# Patient Record
Sex: Male | Born: 1965 | Race: White | Hispanic: No | Smoking: Former smoker
Health system: Southern US, Community
[De-identification: ages and names within clinical notes are randomized; demographics above are authoritative.]

## PROBLEM LIST (undated history)

## (undated) DIAGNOSIS — I10 Essential (primary) hypertension: Secondary | ICD-10-CM

## (undated) HISTORY — PX: EYE SURGERY: SHX253

## (undated) HISTORY — PX: FINGER SURGERY: SHX640

## (undated) HISTORY — DX: Essential (primary) hypertension: I10

---

## 2006-07-21 ENCOUNTER — Inpatient Hospital Stay (HOSPITAL_COMMUNITY): Admission: AC | Admit: 2006-07-21 | Discharge: 2006-07-22 | Payer: Self-pay
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
## 2009-08-06 ENCOUNTER — Emergency Department (HOSPITAL_COMMUNITY): Admission: EM | Admit: 2009-08-06 | Discharge: 2009-08-06 | Payer: Self-pay | Admitting: Emergency Medicine
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
## 2012-01-20 ENCOUNTER — Encounter: Payer: Self-pay | Admitting: Vascular Surgery

## 2012-01-23 ENCOUNTER — Encounter: Payer: Self-pay | Admitting: Vascular Surgery

## 2012-01-23 ENCOUNTER — Ambulatory Visit (INDEPENDENT_AMBULATORY_CARE_PROVIDER_SITE_OTHER): Payer: BC Managed Care – PPO | Admitting: Vascular Surgery

## 2012-01-23 ENCOUNTER — Other Ambulatory Visit: Payer: Self-pay | Admitting: *Deleted

## 2012-01-23 VITALS — BP 132/84 | HR 94 | Resp 18 | Ht 71.0 in | Wt 202.0 lb

## 2012-01-23 DIAGNOSIS — I83893 Varicose veins of bilateral lower extremities with other complications: Secondary | ICD-10-CM

## 2012-01-23 DIAGNOSIS — M79609 Pain in unspecified limb: Secondary | ICD-10-CM

## 2012-01-23 NOTE — Progress Notes (Signed)
Subjective:     Patient ID: Noah Rivera, male   DOB: 06/20/1966, 46 y.o.   MRN: [id number]  HPI this 46 year old male patient was referred for severe venous insufficiency left leg. He has been having bulging varicosities in the thigh and calf on the left leg for the last 15 years. He has had increasing symptoms of aching burning throbbing discomfort as well as swelling in the ankle as the day progresses. He works on his feet as a Visual merchandiser daily. He is not were elastic compression stockings nor elevate his legs or regular basis or take ibuprofen. Symptoms continued to worsen and are more severe the longer he is on his feet.  Past Medical History  Diagnosis Date  . Hypertension     History  Substance Use Topics  . Smoking status: Not on file  . Smokeless tobacco: Not on file  . Alcohol Use:     History reviewed. No pertinent family history.  No Known Allergies  Current outpatient prescriptions:lisinopril (PRINIVIL,ZESTRIL) 20 MG tablet, Take 20 mg by mouth daily., Disp: , Rfl:   BP 132/84  Pulse 94  Resp 18  Ht 5\' 11"  (1.803 m)  Wt 202 lb (91.627 kg)  BMI 28.17 kg/m2  Body mass index is 28.17 kg/(m^2).         Review of Systems denies chest pain, dyspnea on exertion, PND, orthopnea, hemoptysis, claudication. All systems are negative and complete review of systems    Objective:   Physical Exam blood pressure 130/84 heart rate 94 respirations 18 General well-developed well-nourished male in no apparent stress alert and oriented x3 HEENT normal for age Lungs no rhonchi or wheezing Cardiovascular regular rhythm no murmurs carotid pulses 3+ no audible bruits Abdomen soft nontender with no masses Musculoskeletal free major deformities Neurologic normal Lower extremity exam reveals bulging varicosities in the left distal thigh and calf medially with early hyperpigmentation and 1+ edema distally. 3+ dorsalis pedis and posterior tibial pulse is palpable bilaterally. There are  no varicosities in the contralateral right leg.  Today I ordered a venous duplex exam in the left leg which are reviewed and interpreted. There is gross reflux in the left great saphenous vein from the knee to the saphenofemoral junction the vein is large. There is no DVT. The left small saphenous vein is unremarkable.    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 If not significant improvement he should have laser ablation left great saphenous vein with greater than 20 stab phlebectomy

## 2012-01-31 NOTE — Procedures (Unsigned)
LOWER EXTREMITY VENOUS REFLUX EXAM  INDICATION:  Left lower extremity pain and varicosities  EXAM:  Using color-flow imaging and pulse Doppler spectral analysis, the left common femoral, femoral, popliteal, posterior tibial, great and small saphenous veins were evaluated.  There is evidence suggesting deep venous insufficiency in the left lower extremity.  The left saphenofemoral junction is not competent with reflux of >500 milliseconds.  The left GSV is not competent with reflux of >500 milliseconds with the caliber as described below.   The left proximal small saphenous vein demonstrates competency.  GSV Diameter (used if found to be incompetent only)                                                 Right     Left Proximal Greater Saphenous Vein                 cm        0.89 cm Proximal-to-mid-thigh                           cm        0.67 cm Mid thigh                                       cm        1.13 cm Mid-distal thigh                                cm        cm Distal thigh                                    cm        0.94 cm Knee                                            cm        0.93 cm  IMPRESSION: 1. The left great saphenous vein is not competent with reflux of >500     milliseconds. 2. The left great saphenous vein it not tortuous. 3. Deep venous system of the left lower extremity is not competent     with reflux of >500 milliseconds. 4. The left small saphenous vein is competent. 5. There is a perforator vein at the left mid distal medial calf     measuring 0.34 cm that appears to be competent.  ___________________________________________ Quita Skye. Hart [place], M.D.  SH/MEDQ  D:  01/23/2012  T:  01/23/2012  Job:  [id number]

## 2012-04-19 ENCOUNTER — Encounter: Payer: Self-pay | Admitting: Vascular Surgery

## 2012-04-23 ENCOUNTER — Encounter: Payer: Self-pay | Admitting: Vascular Surgery

## 2012-04-23 ENCOUNTER — Ambulatory Visit (INDEPENDENT_AMBULATORY_CARE_PROVIDER_SITE_OTHER): Payer: BC Managed Care – PPO | Admitting: Vascular Surgery

## 2012-04-23 VITALS — BP 127/85 | HR 64 | Resp 20 | Ht 72.0 in | Wt 191.0 lb

## 2012-04-23 DIAGNOSIS — I8310 Varicose veins of unspecified lower extremity with inflammation: Secondary | ICD-10-CM | POA: Insufficient documentation

## 2012-04-23 NOTE — Progress Notes (Signed)
Subjective:     Patient ID: Jeanann Lewandowsky, male   DOB: August 08, 1966, 46 y.o.   MRN: [id number]  HPI this 46 year old male returns today for followup regarding her severe venous insufficiency of the left leg. This has progressed over the past 10-15 years with increasing aching throbbing and burning discomfort. He has been wearing long leg elastic compression stockings 20-30 mm gradient and trying elevation and ibuprofen with no success. His symptoms continued to be as severe as previously. He is able to run and this helps his symptoms slightly while running. He has no history of DVT, thrombophlebitis, stasis ulcers, bleeding, or other complications. 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  Past Medical History  Diagnosis Date  . Hypertension     History  Substance Use Topics  . Smoking status: Former Smoker    Types: Cigarettes    Quit date: 04/23/1989  . Smokeless tobacco: Current User  . Alcohol Use: No    No family history on file.  No Known Allergies  Current outpatient prescriptions:lisinopril (PRINIVIL,ZESTRIL) 20 MG tablet, Take 20 mg by mouth daily., Disp: , Rfl:   BP 127/85  Pulse 64  Resp 20  Ht 6' (1.829 m)  Wt 191 lb (86.637 kg)  BMI 25.90 kg/m2  Body mass index is 25.90 kg/(m^2).          Review of Systems denies chest pain, dyspnea on exertion, PND, orthopnea, hemoptysis, chronic bronchitis, claudication. All systems are negative and a complete review of systems    Objective:   Physical Exam blood pressure 127/85 heart rate 64 respirations 20 General well-developed well-nourished male no apparent stress alert and oriented x3 Lungs no rhonchi or wheezing Left lower extremity with bulging varicosities beginning at the knee extending into the pretibial and posterior calf area. There is mild hyperpigmentation distally with no active ulcer. 1+ edema is noted. Right leg is unremarkable. He has a 3+ posterior tibial pulse palpable in the left  foot.  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 He has severe venous insufficiency involving the left great saphenous system with valvular incompetence with reflux and painful varicosities    Plan:     Patient needs a laser ablation left great saphenous vein with greater than 20 stab phlebectomy. Will proceed with precertification to perform this in near future

## 2012-04-25 ENCOUNTER — Other Ambulatory Visit: Payer: Self-pay | Admitting: *Deleted

## 2012-04-25 DIAGNOSIS — I83893 Varicose veins of bilateral lower extremities with other complications: Secondary | ICD-10-CM

## 2012-05-04 ENCOUNTER — Encounter: Payer: Self-pay | Admitting: Vascular Surgery

## 2012-05-07 ENCOUNTER — Ambulatory Visit (INDEPENDENT_AMBULATORY_CARE_PROVIDER_SITE_OTHER): Payer: BC Managed Care – PPO | Admitting: Vascular Surgery

## 2012-05-07 ENCOUNTER — Encounter: Payer: Self-pay | Admitting: Vascular Surgery

## 2012-05-07 VITALS — BP 137/82 | HR 75 | Resp 16 | Ht 72.0 in | Wt 189.0 lb

## 2012-05-07 DIAGNOSIS — I83893 Varicose veins of bilateral lower extremities with other complications: Secondary | ICD-10-CM

## 2012-05-07 NOTE — Progress Notes (Signed)
Subjective:     Patient ID: Noah Rivera, male   DOB: 1966/02/05, 46 y.o.   MRN: [id number]  HPI this 46 year old male had laser ablation of left great saphenous vein from the knee to the saphenofemoral junction with greater than 20 stab phlebectomy for painful varicosities. This was done under local tumescent anesthesia. A total of 1980 J of energy was utilized. The patient tolerated the procedure well.   Review of Systems     Objective:   Physical ExamBP 137/82  Pulse 75  Resp 16  Ht 6' (1.829 m)  Wt 189 lb (85.73 kg)  BMI 25.63 kg/m2      Assessment:      severe venous insufficiency left leg with well-tolerated laser ablation left great saphenous vein with greater than 20 stab phlebectomy Plan:     Return in one week for venous duplex exam to confirm closure left great saphenous vein

## 2012-05-07 NOTE — Progress Notes (Signed)
Laser Ablation Procedure      Date: 05/07/2012    Noah Rivera DOB:11-19-1966  Consent signed: Yes  Surgeon:J.D. Hart Rochester  Procedure: Laser Ablation: left Greater Saphenous Vein  BP 137/82  Pulse 75  Resp 16  Ht 6' (1.829 m)  Wt 189 lb (85.73 kg)  BMI 25.63 kg/m2  Start time: 3:40   End time: 4:40  Tumescent Anesthesia: 200 cc 0.9% NaCl with 50 cc Lidocaine HCL with 1% Epi and 15 cc 8.4% NaHCO3  Local Anesthesia: 11 cc Lidocaine HCL and NaHCO3 (ratio 2:1)  Pulsed mode: Watts 15 Seconds 1 Pulses:1 Total Pulses:132 Total Energy: 1980 Total Time: 2:12   Stab Phlebectomy: >20 Sites: Calf  Patient tolerated procedure well: Yes  Notes:   Description of Procedure:  After marking the course of the saphenous vein and the secondary varicosities in the standing position, the patient was placed on the operating table in the supine position, and the left leg was prepped and draped in sterile fashion. Local anesthetic was administered, and under ultrasound guidance the saphenous vein was accessed with a micro needle and guide wire; then the micro puncture sheath was placed. A guide wire was inserted to the saphenofemoral junction, followed by a 5 french sheath.  The position of the sheath and then the laser fiber below the junction was confirmed using the ultrasound and visualization of the aiming beam.  Tumescent anesthesia was administered along the course of the saphenous vein using ultrasound guidance. Protective laser glasses were placed on the patient, and the laser was fired at 15 watt pulsed mode advancing 1-2 mm per sec.  For a total of 1980 joules.  A steri strip was applied to the puncture site.  The patient was then put into Trendelenburg position.  Local anesthetic was utilized overlying the marked varicosities.  Greater than 20 stab wounds were made using the tip of an 11 blade; and using the vein hook,  The phlebectomies were performed using a hemostat to avulse these  varicosities.  Adequate hemostasis was achieved, and steri strips were applied to the stab wound.     ABD pads and thigh high compression stockings were applied.  Ace wrap bandages were applied over the phlebectomy sites and at the top of the saphenofemoral junction.  Blood loss was less than 15 cc.  The patient ambulated out of the operating room having tolerated the procedure well.

## 2012-05-08 ENCOUNTER — Encounter: Payer: Self-pay | Admitting: Vascular Surgery

## 2012-05-08 ENCOUNTER — Telehealth: Payer: Self-pay | Admitting: *Deleted

## 2012-05-08 NOTE — Telephone Encounter (Signed)
Patient doing well. A little sore. Encouraged him to use ice and take Ibuprofen as directed. No bleeding from stab sites. Reminded him of his fu appt.

## 2012-05-11 ENCOUNTER — Encounter: Payer: Self-pay | Admitting: Vascular Surgery

## 2012-05-15 ENCOUNTER — Encounter: Payer: Self-pay | Admitting: Vascular Surgery

## 2012-05-15 ENCOUNTER — Encounter (INDEPENDENT_AMBULATORY_CARE_PROVIDER_SITE_OTHER): Payer: BC Managed Care – PPO | Admitting: *Deleted

## 2012-05-15 ENCOUNTER — Ambulatory Visit (INDEPENDENT_AMBULATORY_CARE_PROVIDER_SITE_OTHER): Payer: BC Managed Care – PPO | Admitting: Vascular Surgery

## 2012-05-15 VITALS — BP 131/82 | HR 71 | Resp 16 | Ht 72.0 in | Wt 189.0 lb

## 2012-05-15 DIAGNOSIS — Z48812 Encounter for surgical aftercare following surgery on the circulatory system: Secondary | ICD-10-CM

## 2012-05-15 DIAGNOSIS — I83893 Varicose veins of bilateral lower extremities with other complications: Secondary | ICD-10-CM

## 2012-05-15 NOTE — Progress Notes (Signed)
Subjective:     Patient ID: Noah Rivera, male   DOB: [birth year], 46 y.o.   MRN: [id number]  HPI this 46 year old male is one week post laser ablation left great saphenous vein with greater than 20 stab phlebectomy for painful varicosities. He had significant discomfort along the course of the great saphenous vein from the knee to the saphenofemoral junction. His vein was located very superficially. He has had no distal edema. He is wearing his elastic compression stocking. Initially he did not take his ibuprofen but then did begin taking it. He returned to work 2 days later.   Review of Systems     Objective:   Physical ExamBP 131/82  Pulse 71  Resp 16  Ht 6' (1.829 m)  Wt 189 lb (85.73 kg)  BMI 25.63 kg/m2  General well-developed well-nourished male in no apparent distress Left leg with mild/moderate discomfort along the course of the great saphenous vein from the saphenofemoral junction to the knee. There is mild erythema overlying the great saphenous vein in the distal third of the thigh. Stab phlebectomy sites have all healed nicely. There is no distal edema. He has 3+ dorsalis pedis pulse palpable.  Today I ordered a venous duplex exam of the left leg which are reviewed and interpreted. There is no DVT. The great saphenous vein is totally closed from the saphenofemoral junction to the distal thigh.    Assessment:     Successful laser ablation left great saphenous vein with greater than 20 stab phlebectomy of secondary varicosities    one more week of elastic compression-20-30 mm long leg Return to see [place] on when necessary basis    Plan:

## 2012-05-21 NOTE — Procedures (Unsigned)
DUPLEX DEEP VENOUS EXAM - LOWER EXTREMITY  INDICATION:  Followup left lower extremity ablation  HISTORY:  Edema:  Yes Trauma/Surgery:  Left great saphenous vein ablation 05/07/2012 Pain:  Yes PE:  No Previous DVT:  No Anticoagulants:  No Other:  DUPLEX EXAM:               CFV   SFV   PopV  PTV    GSV               R  L  R  L  R  L  R   L  R  L Thrombosis       o     o     o      o     Closed Spontaneous      +     +     +      + Phasic           +     +     +      + Augmentation     +     +     +      + Compressible     +     +     +      + Competent        o     +     +      +  Legend:  + - yes  o - no  p - partial  D - decreased  IMPRESSION: 1. All deep veins of the left lower extremity appear patent and     compressible suggestive of no evidence of deep venous thrombosis. 2. The left greater saphenous vein appears closed in the proximal to     distal thigh and medial knee status post ablation.      _____________________________ Quita Skye Hart [place], M.D.  SS/MEDQ  D:  05/15/2012  T:  05/15/2012  Job:  [id number]

## 2019-07-22 ENCOUNTER — Encounter: Payer: Self-pay | Admitting: *Deleted

## 2019-09-09 ENCOUNTER — Ambulatory Visit (INDEPENDENT_AMBULATORY_CARE_PROVIDER_SITE_OTHER): Payer: Self-pay | Admitting: *Deleted

## 2019-09-09 ENCOUNTER — Other Ambulatory Visit: Payer: Self-pay

## 2019-09-09 ENCOUNTER — Telehealth: Payer: Self-pay | Admitting: *Deleted

## 2019-09-09 DIAGNOSIS — Z1211 Encounter for screening for malignant neoplasm of colon: Secondary | ICD-10-CM

## 2019-09-09 NOTE — Telephone Encounter (Signed)
Pt was returning a call. [phone]

## 2019-09-09 NOTE — Progress Notes (Signed)
Pt requested to have his procedure in Jan 2021.  Informed pt that I would contact him to let him know if he needed an ov first after provider review.  If no ov needed, then he is aware that we will get him scheduled in Jan once procedure schedules have been released.  Pt voiced understanding.

## 2019-09-09 NOTE — Progress Notes (Signed)
Gastroenterology Pre-Procedure Review  Request Date: 09/09/2019 Requesting Physician: Dr. Wende Neighbors, no previous TCS  PATIENT REVIEW QUESTIONS: The patient responded to the following health history questions as indicated:    1. Diabetes Melitis: No 2. Joint replacements in the past 12 months: No 3. Major health problems in the past 3 months: No 4. Has an artificial valve or MVP: No 5. Has a defibrillator: No 6. Has been advised in past to take antibiotics in advance of a procedure like teeth cleaning: No 7. Family history of colon cancer: No  8. Alcohol Use: Yes, 6 or 7 beers a day 9. History of sleep apnea: No  10. History of coronary artery or other vascular stents placed within the last 12 months: No 11. History of any prior anesthesia complications: No    MEDICATIONS & ALLERGIES:    Patient reports the following regarding taking any blood thinners:   Plavix? No Aspirin? No Coumadin? No Brilinta? No Xarelto? No Eliquis? No Pradaxa? No Savaysa? No Effient? No  Patient confirms/reports the following medications:  Current Outpatient Medications  Medication Sig Dispense Refill  . amLODipine (NORVASC) 5 MG tablet Take 5 mg by mouth daily.    Marland Kitchen olmesartan (BENICAR) 40 MG tablet Take 40 mg by mouth daily.     No current facility-administered medications for this visit.     Patient confirms/reports the following allergies:  No Known Allergies  No orders of the defined types were placed in this encounter.   AUTHORIZATION INFORMATION Primary Insurance: [place],  [place] #[id number],  Group #: [id number] Pre-Cert / Auth required:  Pre-Cert / Auth #:   SCHEDULE INFORMATION: Procedure has been scheduled as follows:  Date: , Time:  Location:  This Gastroenterology Pre-Precedure Review Form is being routed to the following provider(s): Walden Field, NP

## 2019-09-09 NOTE — Progress Notes (Signed)
He will need OV due to daily etoh use.

## 2019-09-10 NOTE — Telephone Encounter (Signed)
Spoke with pt and he is aware that our provider suggests an ov prior to scheduling a procedure. Pt scheduled ov for 10/21/2019.

## 2019-09-10 NOTE — Progress Notes (Signed)
Called and informed pt that he needed an ov.  Pt requested Nov appt.  Pt scheduled it for 10/21/2019.

## 2019-10-21 ENCOUNTER — Ambulatory Visit: Payer: BLUE CROSS/BLUE SHIELD | Admitting: Gastroenterology

## 2019-11-12 ENCOUNTER — Ambulatory Visit: Payer: BLUE CROSS/BLUE SHIELD | Admitting: Gastroenterology

## 2019-11-12 ENCOUNTER — Other Ambulatory Visit: Payer: Self-pay

## 2019-11-12 ENCOUNTER — Encounter: Payer: Self-pay | Admitting: Gastroenterology

## 2019-11-12 DIAGNOSIS — Z1211 Encounter for screening for malignant neoplasm of colon: Secondary | ICD-10-CM | POA: Insufficient documentation

## 2019-11-12 DIAGNOSIS — F1010 Alcohol abuse, uncomplicated: Secondary | ICD-10-CM

## 2019-11-12 MED ORDER — PEG 3350-KCL-NA BICARB-NACL 420 G PO SOLR: 4000.0000 mL | ORAL | 0 refills | Status: AC

## 2019-11-12 NOTE — Assessment & Plan Note (Signed)
First-ever screening colonoscopy in the near future.  Plan for deep sedation given daily alcohol consumption.  I have discussed the risks, alternatives, benefits with regards to but not limited to the risk of reaction to medication, bleeding, infection, perforation and the patient is agreeable to proceed. Written consent to be obtained.

## 2019-11-12 NOTE — Progress Notes (Signed)
Primary Care Physician:  Celene Squibb, MD  Primary Gastroenterologist:  Garfield Cornea, MD   Chief Complaint  Patient presents with  . Consult    TCS never done prior. No FH    HPI:  Noah Rivera is a 53 y.o. male here to schedule first ever colonoscopy.  Due to daily alcohol consumption he will need deep sedation.  No family history of colon cancer or colon polyps.  Patient denies constipation, diarrhea, abdominal pain, melena, rectal bleeding.  No upper GI symptoms.  He has a history of daily alcohol consumption, six to eight 12 ounce beers per day.  No prior DUIs.  Patient does not feel like he is consuming excessive amounts of alcohol.  Not interested in cutting back.  He states he has had some abnormal LFTs in the past with heavier use.  Aware of potential toxicity to the liver.  "Going to die from something 1 day anyway".  Current Outpatient Medications  Medication Sig Dispense Refill  . amLODipine (NORVASC) 5 MG tablet Take 5 mg by mouth daily.    Marland Kitchen olmesartan (BENICAR) 40 MG tablet Take 40 mg by mouth daily.     No current facility-administered medications for this visit.     Allergies as of 11/12/2019  . (No Known Allergies)    Past Medical History:  Diagnosis Date  . Hypertension     Past Surgical History:  Procedure Laterality Date  . EYE SURGERY    . FINGER SURGERY      Family History  Problem Relation Age of Onset  . Colon cancer Neg Hx     Social History   Socioeconomic History  . Marital status: Divorced    Spouse name: Not on file  . Number of children: Not on file  . Years of education: Not on file  . Highest education level: Not on file  Occupational History  . Not on file  Social Needs  . Financial resource strain: Not on file  . Food insecurity    Worry: Not on file    Inability: Not on file  . Transportation needs    Medical: Not on file    Non-medical: Not on file  Tobacco Use  . Smoking status: Former Smoker    Types: Cigarettes     Quit date: 04/23/1989    Years since quitting: 30.5  . Smokeless tobacco: Former Network engineer and Sexual Activity  . Alcohol use: Yes    Comment: 6 or 7 beers a day  . Drug use: No  . Sexual activity: Not on file  Lifestyle  . Physical activity    Days per week: Not on file    Minutes per session: Not on file  . Stress: Not on file  Relationships  . Social Herbalist on phone: Not on file    Gets together: Not on file    Attends religious service: Not on file    Active member of club or organization: Not on file    Attends meetings of clubs or organizations: Not on file    Relationship status: Not on file  . Intimate partner violence    Fear of current or ex partner: Not on file    Emotionally abused: Not on file    Physically abused: Not on file    Forced sexual activity: Not on file  Other Topics Concern  . Not on file  Social History Narrative  . Not on file  ROS:  General: Negative for anorexia, weight loss, fever, chills, fatigue, weakness. Eyes: Negative for vision changes.  ENT: Negative for hoarseness, difficulty swallowing , nasal congestion. CV: Negative for chest pain, angina, palpitations, dyspnea on exertion, peripheral edema.  Respiratory: Negative for dyspnea at rest, dyspnea on exertion, cough, sputum, wheezing.  GI: See history of present illness. GU:  Negative for dysuria, hematuria, urinary incontinence, urinary frequency, nocturnal urination.  MS: Negative for joint pain, low back pain.  Derm: Negative for rash or itching.  Neuro: Negative for weakness, abnormal sensation, seizure, frequent headaches, memory loss, confusion.  Psych: Negative for anxiety, depression, suicidal ideation, hallucinations.  Endo: Negative for unusual weight change.  Heme: Negative for bruising or bleeding. Allergy: Negative for rash or hives.    Physical Examination:  BP (!) 147/94   Pulse 75   Ht 6' (1.829 m)   Wt 229 lb (103.9 kg)   BMI 31.06  kg/m    General: Well-nourished, well-developed in no acute distress.  Head: Normocephalic, atraumatic.   Eyes: Conjunctiva pink, no icterus. Mouth: Oropharyngeal mucosa moist and pink , no lesions erythema or exudate. Neck: Supple without thyromegaly, masses, or lymphadenopathy.  Lungs: Clear to auscultation bilaterally.  Heart: Regular rate and rhythm, no murmurs rubs or gallops.  Abdomen: Bowel sounds are normal, nontender, nondistended, no hepatosplenomegaly or masses, no abdominal bruits or    hernia , no rebound or guarding.   Rectal: Deferred Extremities: No lower extremity edema. No clubbing or deformities.  Neuro: Alert and oriented x 4 , grossly normal neurologically.  Skin: Warm and dry, no rash or jaundice.   Psych: Alert and cooperative, normal mood and affect.    Imaging Studies: No results found.

## 2019-11-12 NOTE — Patient Instructions (Signed)
1. Colonoscopy as scheduled. See separate instructions.  

## 2020-01-15 ENCOUNTER — Other Ambulatory Visit: Payer: Self-pay | Admitting: Internal Medicine

## 2020-01-15 ENCOUNTER — Other Ambulatory Visit (HOSPITAL_COMMUNITY): Payer: Self-pay | Admitting: Internal Medicine

## 2020-01-15 DIAGNOSIS — R7989 Other specified abnormal findings of blood chemistry: Secondary | ICD-10-CM

## 2020-01-22 ENCOUNTER — Ambulatory Visit (HOSPITAL_COMMUNITY)
Admission: RE | Admit: 2020-01-22 | Discharge: 2020-01-22 | Disposition: A | Discharge status: Home / Self Care | Payer: BLUE CROSS/BLUE SHIELD | Source: Ambulatory Visit | Attending: Internal Medicine | Admitting: Internal Medicine

## 2020-01-22 ENCOUNTER — Other Ambulatory Visit: Payer: Self-pay

## 2020-01-22 DIAGNOSIS — R7989 Other specified abnormal findings of blood chemistry: Secondary | ICD-10-CM | POA: Diagnosis present

## 2020-02-04 ENCOUNTER — Other Ambulatory Visit: Payer: Self-pay

## 2020-02-04 ENCOUNTER — Other Ambulatory Visit (HOSPITAL_COMMUNITY)
Admission: RE | Admit: 2020-02-04 | Discharge: 2020-02-04 | Disposition: A | Discharge status: Home / Self Care | Payer: BLUE CROSS/BLUE SHIELD | Source: Ambulatory Visit | Attending: Internal Medicine | Admitting: Internal Medicine

## 2020-02-04 ENCOUNTER — Telehealth: Payer: Self-pay | Admitting: Internal Medicine

## 2020-02-04 ENCOUNTER — Encounter (HOSPITAL_COMMUNITY)
Admission: RE | Admit: 2020-02-04 | Discharge: 2020-02-04 | Disposition: A | Discharge status: Home / Self Care | Payer: BLUE CROSS/BLUE SHIELD | Source: Ambulatory Visit | Attending: Internal Medicine | Admitting: Internal Medicine

## 2020-02-04 DIAGNOSIS — Z20822 Contact with and (suspected) exposure to covid-19: Secondary | ICD-10-CM | POA: Diagnosis not present

## 2020-02-04 DIAGNOSIS — Z01812 Encounter for preprocedural laboratory examination: Secondary | ICD-10-CM | POA: Diagnosis present

## 2020-02-04 NOTE — Telephone Encounter (Signed)
Called pt, he will pick up Miralax instructions today.

## 2020-02-04 NOTE — Telephone Encounter (Signed)
Pt is scheduled with [facility] on 02/06/2020 and his prep is on back order and needs something else. [phone]

## 2020-02-05 ENCOUNTER — Telehealth: Payer: Self-pay | Admitting: *Deleted

## 2020-02-05 ENCOUNTER — Encounter: Payer: Self-pay | Admitting: *Deleted

## 2020-02-05 LAB — SARS CORONAVIRUS 2 (TAT 6-24 HRS): SARS Coronavirus 2: NEGATIVE
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
## 2020-03-03 ENCOUNTER — Other Ambulatory Visit: Payer: Self-pay

## 2020-03-03 ENCOUNTER — Encounter (HOSPITAL_COMMUNITY): Payer: Self-pay

## 2020-03-04 ENCOUNTER — Other Ambulatory Visit (HOSPITAL_COMMUNITY)
Admission: RE | Admit: 2020-03-04 | Discharge: 2020-03-04 | Disposition: A | Discharge status: Home / Self Care | Payer: BLUE CROSS/BLUE SHIELD | Source: Ambulatory Visit | Attending: Internal Medicine | Admitting: Internal Medicine

## 2020-03-04 ENCOUNTER — Encounter (HOSPITAL_COMMUNITY)
Admission: RE | Admit: 2020-03-04 | Discharge: 2020-03-04 | Disposition: A | Discharge status: Home / Self Care | Payer: BLUE CROSS/BLUE SHIELD | Source: Ambulatory Visit | Attending: Internal Medicine | Admitting: Internal Medicine

## 2020-03-04 DIAGNOSIS — Z01812 Encounter for preprocedural laboratory examination: Secondary | ICD-10-CM | POA: Insufficient documentation

## 2020-03-04 DIAGNOSIS — Z20822 Contact with and (suspected) exposure to covid-19: Secondary | ICD-10-CM | POA: Insufficient documentation

## 2020-03-04 LAB — SARS CORONAVIRUS 2 (TAT 6-24 HRS): SARS Coronavirus 2: NEGATIVE

## 2020-03-06 ENCOUNTER — Ambulatory Visit (HOSPITAL_COMMUNITY)
Admission: RE | Admit: 2020-03-06 | Discharge: 2020-03-06 | Disposition: A | Discharge status: Home / Self Care | Payer: BLUE CROSS/BLUE SHIELD | Attending: Internal Medicine | Admitting: Internal Medicine

## 2020-03-06 ENCOUNTER — Ambulatory Visit (HOSPITAL_COMMUNITY): Payer: BLUE CROSS/BLUE SHIELD | Admitting: Anesthesiology

## 2020-03-06 ENCOUNTER — Encounter (HOSPITAL_COMMUNITY)
Admission: RE | Disposition: A | Discharge status: Home / Self Care | Payer: Self-pay | Source: Home / Self Care | Attending: Internal Medicine

## 2020-03-06 ENCOUNTER — Encounter (HOSPITAL_COMMUNITY): Payer: Self-pay | Admitting: Internal Medicine

## 2020-03-06 DIAGNOSIS — D120 Benign neoplasm of cecum: Secondary | ICD-10-CM | POA: Insufficient documentation

## 2020-03-06 DIAGNOSIS — Z1211 Encounter for screening for malignant neoplasm of colon: Secondary | ICD-10-CM

## 2020-03-06 DIAGNOSIS — G4730 Sleep apnea, unspecified: Secondary | ICD-10-CM | POA: Insufficient documentation

## 2020-03-06 DIAGNOSIS — K5730 Diverticulosis of large intestine without perforation or abscess without bleeding: Secondary | ICD-10-CM | POA: Diagnosis not present

## 2020-03-06 DIAGNOSIS — R0683 Snoring: Secondary | ICD-10-CM | POA: Insufficient documentation

## 2020-03-06 DIAGNOSIS — Z87891 Personal history of nicotine dependence: Secondary | ICD-10-CM | POA: Insufficient documentation

## 2020-03-06 DIAGNOSIS — K635 Polyp of colon: Secondary | ICD-10-CM | POA: Diagnosis not present

## 2020-03-06 DIAGNOSIS — I10 Essential (primary) hypertension: Secondary | ICD-10-CM | POA: Insufficient documentation

## 2020-03-06 HISTORY — PX: COLONOSCOPY WITH PROPOFOL: SHX5780

## 2020-03-06 HISTORY — PX: POLYPECTOMY: SHX5525

## 2020-03-06 SURGERY — COLONOSCOPY WITH PROPOFOL
Anesthesia: General

## 2020-03-06 MED ORDER — LACTATED RINGERS IV SOLN: Freq: Once | INTRAVENOUS | Status: AC

## 2020-03-06 MED ORDER — PROPOFOL 10 MG/ML IV BOLUS
INTRAVENOUS | Status: AC
  Filled 2020-03-06: qty 20

## 2020-03-06 MED ORDER — PROPOFOL 10 MG/ML IV BOLUS
INTRAVENOUS | Status: AC
  Filled 2020-03-06: qty 40

## 2020-03-06 MED ORDER — LIDOCAINE HCL (CARDIAC) PF 100 MG/5ML IV SOSY
PREFILLED_SYRINGE | INTRAVENOUS | Status: DC | PRN
  Administered 2020-03-06: 100 mg via INTRAVENOUS

## 2020-03-06 MED ORDER — PROPOFOL 500 MG/50ML IV EMUL
INTRAVENOUS | Status: DC | PRN
  Administered 2020-03-06: 150 ug/kg/min via INTRAVENOUS

## 2020-03-06 MED ORDER — GLYCOPYRROLATE 0.2 MG/ML IJ SOLN
INTRAMUSCULAR | Status: DC | PRN
  Administered 2020-03-06: .2 mg via INTRAVENOUS

## 2020-03-06 MED ORDER — CHLORHEXIDINE GLUCONATE CLOTH 2 % EX PADS: 6.0000 | MEDICATED_PAD | Freq: Once | CUTANEOUS | Status: DC

## 2020-03-06 MED ORDER — KETAMINE HCL 50 MG/5ML IJ SOSY
PREFILLED_SYRINGE | INTRAMUSCULAR | Status: AC
  Filled 2020-03-06: qty 5

## 2020-03-06 MED ORDER — KETAMINE HCL 10 MG/ML IJ SOLN
INTRAMUSCULAR | Status: DC | PRN
  Administered 2020-03-06: 20 mg via INTRAVENOUS
  Administered 2020-03-06: 10 mg via INTRAVENOUS

## 2020-03-06 MED ORDER — PHENYLEPHRINE 40 MCG/ML (10ML) SYRINGE FOR IV PUSH (FOR BLOOD PRESSURE SUPPORT)
PREFILLED_SYRINGE | INTRAVENOUS | Status: AC
  Filled 2020-03-06: qty 10

## 2020-03-06 MED ORDER — PHENYLEPHRINE HCL (PRESSORS) 10 MG/ML IV SOLN
INTRAVENOUS | Status: DC | PRN
  Administered 2020-03-06: 80 ug via INTRAVENOUS

## 2020-03-06 NOTE — Discharge Instructions (Signed)
Diverticulosis  Diverticulosis is a condition that develops when small pouches (diverticula) form in the wall of the large intestine (colon). The colon is where water is absorbed and stool (feces) is formed. The pouches form when the inside layer of the colon pushes through weak spots in the outer layers of the colon. You may have a few pouches or many of them. The pouches usually do not cause problems unless they become inflamed or infected. When this happens, the condition is called diverticulitis. What are the causes? The cause of this condition is not known. What increases the risk? The following factors may make you more likely to develop this condition:  Being older than age 18. Your risk for this condition increases with age. Diverticulosis is rare among people younger than age 47. By age 73, many people have it.  Eating a low-fiber diet.  Having frequent constipation.  Being overweight.  Not getting enough exercise.  Smoking.  Taking over-the-counter pain medicines, like aspirin and ibuprofen.  Having a family history of diverticulosis. What are the signs or symptoms? In most people, there are no symptoms of this condition. If you do have symptoms, they may include:  Bloating.  Cramps in the abdomen.  Constipation or diarrhea.  Pain in the lower left side of the abdomen. How is this diagnosed? Because diverticulosis usually has no symptoms, it is most often diagnosed during an exam for other colon problems. The condition may be diagnosed by:  Using a flexible scope to examine the colon (colonoscopy).  Taking an X-ray of the colon after dye has been put into the colon (barium enema).  Having a CT scan. How is this treated? You may not need treatment for this condition. Your health care provider may recommend treatment to prevent problems. You may need treatment if you have symptoms or if you previously had diverticulitis. Treatment may include:  Eating a high-fiber  diet.  Taking a fiber supplement.  Taking a live bacteria supplement (probiotic).  Taking medicine to relax your colon. Follow these instructions at home: Medicines  Take over-the-counter and prescription medicines only as told by your health care provider.  If told by your health care provider, take a fiber supplement or probiotic. Constipation prevention Your condition may cause constipation. To prevent or treat constipation, you may need to:  Drink enough fluid to keep your urine pale yellow.  Take over-the-counter or prescription medicines.  Eat foods that are high in fiber, such as beans, whole grains, and fresh fruits and vegetables.  Limit foods that are high in fat and processed sugars, such as fried or sweet foods.  General instructions  Try not to strain when you have a bowel movement.  Keep all follow-up visits as told by your health care provider. This is important. Contact a health care provider if you:  Have pain in your abdomen.  Have bloating.  Have cramps.  Have not had a bowel movement in 3 days. Get help right away if:  Your pain gets worse.  Your bloating becomes very bad.  You have a fever or chills, and your symptoms suddenly get worse.  You vomit.  You have bowel movements that are bloody or black.  You have bleeding from your rectum. Summary  Diverticulosis is a condition that develops when small pouches (diverticula) form in the wall of the large intestine (colon).  You may have a few pouches or many of them.  This condition is most often diagnosed during an exam for other colon  problems.  Treatment may include increasing the fiber in your diet, taking supplements, or taking medicines. This information is not intended to replace advice given to you by your health care provider. Make sure you discuss any questions you have with your health care provider. Document Revised: 07/04/2019 Document Reviewed: 07/04/2019 Elsevier Patient  Education  [place]. Colon Polyps  Polyps are tissue growths inside the body. Polyps can grow in many places, including the large intestine (colon). A polyp may be a round bump or a mushroom-shaped growth. You could have one polyp or several. Most colon polyps are noncancerous (benign). However, some colon polyps can become cancerous over time. Finding and removing the polyps early can help prevent this. What are the causes? The exact cause of colon polyps is not known. What increases the risk? You are more likely to develop this condition if you:  Have a family history of colon cancer or colon polyps.  Are older than 69 or older than 45 if you are African American.  Have inflammatory bowel disease, such as ulcerative colitis or Crohn's disease.  Have certain hereditary conditions, such as: ? Familial adenomatous polyposis. ? Lynch syndrome. ? Turcot syndrome. ? Peutz-Jeghers syndrome.  Are overweight.  Smoke cigarettes.  Do not get enough exercise.  Drink too much alcohol.  Eat a diet that is high in fat and red meat and low in fiber.  Had childhood cancer that was treated with abdominal radiation. What are the signs or symptoms? Most polyps do not cause symptoms. If you have symptoms, they may include:  Blood coming from your rectum when having a bowel movement.  Blood in your stool. The stool may look dark red or black.  Abdominal pain.  A change in bowel habits, such as constipation or diarrhea. How is this diagnosed? This condition is diagnosed with a colonoscopy. This is a procedure in which a lighted, flexible scope is inserted into the anus and then passed into the colon to examine the area. Polyps are sometimes found when a colonoscopy is done as part of routine cancer screening tests. How is this treated? Treatment for this condition involves removing any polyps that are found. Most polyps can be removed during a colonoscopy. Those polyps will then  be tested for cancer. Additional treatment may be needed depending on the results of testing. Follow these instructions at home: Lifestyle  Maintain a healthy weight, or lose weight if recommended by your health care provider.  Exercise every day or as told by your health care provider.  Do not use any products that contain nicotine or tobacco, such as cigarettes and e-cigarettes. If you need help quitting, ask your health care provider.  If you drink alcohol, limit how much you have: ? 0-1 drink a day for women. ? 0-2 drinks a day for men.  Be aware of how much alcohol is in your drink. In the [place] one drink equals one 12 oz bottle of beer (355 mL), one 5 oz glass of wine (148 mL), or one 1 oz shot of hard liquor (44 mL). Eating and drinking   Eat foods that are high in fiber, such as fruits, vegetables, and whole grains.  Eat foods that are high in calcium and vitamin D, such as milk, cheese, yogurt, eggs, liver, fish, and broccoli.  Limit foods that are high in fat, such as fried foods and desserts.  Limit the amount of red meat and processed meat you eat, such as hot dogs, sausage, bacon,  and lunch meats. General instructions  Keep all follow-up visits as told by your health care provider. This is important. ? This includes having regularly scheduled colonoscopies. ? Talk to your health care provider about when you need a colonoscopy. Contact a health care provider if:  You have new or worsening bleeding during a bowel movement.  You have new or increased blood in your stool.  You have a change in bowel habits.  You lose weight for no known reason. Summary  Polyps are tissue growths inside the body. Polyps can grow in many places, including the colon.  Most colon polyps are noncancerous (benign), but some can become cancerous over time.  This condition is diagnosed with a colonoscopy.  Treatment for this condition involves removing any polyps that are found. Most  polyps can be removed during a colonoscopy. This information is not intended to replace advice given to you by your health care provider. Make sure you discuss any questions you have with your health care provider. Document Revised: 03/22/2018 Document Reviewed: 03/22/2018 Elsevier Patient Education  [place].  Colonoscopy Discharge Instructions  Read the instructions outlined below and refer to this sheet in the next few weeks. These discharge instructions provide you with general information on caring for yourself after you leave the hospital. Your doctor may also give you specific instructions. While your treatment has been planned according to the most current medical practices available, unavoidable complications occasionally occur. If you have any problems or questions after discharge, call Dr. Gala Romney at [phone]. ACTIVITY  You may resume your regular activity, but move at a slower pace for the next 24 hours.   Take frequent rest periods for the next 24 hours.   Walking will help get rid of the air and reduce the bloated feeling in your belly (abdomen).   No driving for 24 hours (because of the medicine (anesthesia) used during the test).    Do not sign any important legal documents or operate any machinery for 24 hours (because of the anesthesia used during the test).  NUTRITION  Drink plenty of fluids.   You may resume your normal diet as instructed by your doctor.   Begin with a light meal and progress to your normal diet. Heavy or fried foods are harder to digest and may make you feel sick to your stomach (nauseated).   Avoid alcoholic beverages for 24 hours or as instructed.  MEDICATIONS  You may resume your normal medications unless your doctor tells you otherwise.  WHAT YOU CAN EXPECT TODAY  Some feelings of bloating in the abdomen.   Passage of more gas than usual.   Spotting of blood in your stool or on the toilet paper.  IF YOU HAD POLYPS REMOVED DURING  THE COLONOSCOPY:  No aspirin products for 7 days or as instructed.   No alcohol for 7 days or as instructed.   Eat a soft diet for the next 24 hours.  FINDING OUT THE RESULTS OF YOUR TEST Not all test results are available during your visit. If your test results are not back during the visit, make an appointment with your caregiver to find out the results. Do not assume everything is normal if you have not heard from your caregiver or the medical facility. It is important for you to follow up on all of your test results.  SEEK IMMEDIATE MEDICAL ATTENTION IF:  You have more than a spotting of blood in your stool.   Your belly is swollen (  abdominal distention).   You are nauseated or vomiting.   You have a temperature over 101.   You have abdominal pain or discomfort that is severe or gets worse throughout the day.    Colon polyp and diverticulosis information provided  Further recommendations to follow pending review of pathology report  At patient request, I called Bengie Cormican at [phone] and reviewed results

## 2020-03-06 NOTE — H&P (Signed)
@LOGO @   Primary Care Physician:  Celene Squibb, MD Primary Gastroenterologist:  Dr. Gala Romney  Pre-Procedure History & Physical: HPI:  Noah Rivera is a 54 y.o. male is here for a screening colonoscopy.  First ever average rescreening examination.  No GI symptoms.  Past Medical History:  Diagnosis Date  . Hypertension     Past Surgical History:  Procedure Laterality Date  . EYE SURGERY Left   . FINGER SURGERY Left    pinky    Prior to Admission medications   Medication Sig Start Date End Date Taking? Authorizing Provider  amLODipine (NORVASC) 5 MG tablet Take 5 mg by mouth daily.   Yes [provider]  olmesartan (BENICAR) 40 MG tablet Take 40 mg by mouth daily.   Yes [provider]  polyethylene glycol-electrolytes (TRILYTE) 420 g solution Take 4,000 mLs by mouth as directed. 11/12/19   Daneil Dolin, MD    Allergies as of 11/12/2019  . (No Known Allergies)    Family History  Problem Relation Age of Onset  . Colon cancer Neg Hx     Social History   Socioeconomic History  . Marital status: Divorced    Spouse name: Not on file  . Number of children: Not on file  . Years of education: Not on file  . Highest education level: Not on file  Occupational History  . Not on file  Tobacco Use  . Smoking status: Former Smoker    Packs/day: 0.50    Years: 3.00    Pack years: 1.50    Types: Cigarettes    Quit date: 04/23/1989    Years since quitting: 30.8  . Smokeless tobacco: Former Network engineer and Sexual Activity  . Alcohol use: Yes    Comment: 6 or 7 beers a day  . Drug use: No  . Sexual activity: Yes  Other Topics Concern  . Not on file  Social History Narrative  . Not on file   Social Determinants of Health   Financial Resource Strain:   . Difficulty of Paying Living Expenses:   Food Insecurity:   . Worried About Charity fundraiser in the Last Year:   . Arboriculturist in the Last Year:   Transportation Needs:   . Lexicographer (Medical):   Marland Kitchen Lack of Transportation (Non-Medical):   Physical Activity:   . Days of Exercise per Week:   . Minutes of Exercise per Session:   Stress:   . Feeling of Stress :   Social Connections:   . Frequency of Communication with Friends and Family:   . Frequency of Social Gatherings with Friends and Family:   . Attends Religious Services:   . Active Member of Clubs or Organizations:   . Attends Archivist Meetings:   Marland Kitchen Marital Status:   Intimate Partner Violence:   . Fear of Current or Ex-Partner:   . Emotionally Abused:   Marland Kitchen Physically Abused:   . Sexually Abused:     Review of Systems: See HPI, otherwise negative ROS  Physical Exam: There were no vitals taken for this visit. General:   Alert,  Well-developed, well-nourished, pleasant and cooperative in NAD Lungs:  Clear throughout to auscultation.   No wheezes, crackles, or rhonchi. No acute distress. Heart:  Regular rate and rhythm; no murmurs, clicks, rubs,  or gallops. Abdomen:  Soft, nontender and nondistended. No masses, hepatosplenomegaly or hernias noted. Normal bowel sounds, without guarding, and without rebound.  Impression/Plan: Noah Rivera is now here to undergo a screening colonoscopy.  First ever average rescreening examination.  Risks, benefits, limitations, imponderables and alternatives regarding colonoscopy have been reviewed with the patient. Questions have been answered. All parties agreeable.     Notice:  This dictation was prepared with Dragon dictation along with smaller phrase technology. Any transcriptional errors that result from this process are unintentional and may not be corrected upon review.

## 2020-03-06 NOTE — Transfer of Care (Signed)
Immediate Anesthesia Transfer of Care Note  Patient: Noah Rivera  Procedure(s) Performed: COLONOSCOPY WITH PROPOFOL (N/A ) POLYPECTOMY  Patient Location: PACU  Anesthesia Type:MAC  Level of Consciousness: awake, alert  and oriented  Airway & Oxygen Therapy: Patient Spontanous Breathing  Post-op Assessment: Report given to RN and Post -op Vital signs reviewed and stable  Post vital signs: Reviewed and stable  Last Vitals:  Vitals Value Taken Time  BP    Temp    Pulse    Resp    SpO2      Last Pain:  Vitals:   03/06/20 1251  TempSrc:   PainSc: 0-No pain      Patients Stated Pain Goal: 6 (97/67/34 [id number])  Complications: No apparent anesthesia complications

## 2020-03-06 NOTE — Anesthesia Postprocedure Evaluation (Signed)
Anesthesia Post Note  Patient: Noah Rivera  Procedure(s) Performed: COLONOSCOPY WITH PROPOFOL (N/A ) POLYPECTOMY  Patient location during evaluation: PACU Anesthesia Type: General Level of consciousness: awake, oriented, awake and alert and patient cooperative Pain management: pain level controlled Vital Signs Assessment: post-procedure vital signs reviewed and stable Respiratory status: spontaneous breathing, respiratory function stable and nonlabored ventilation Cardiovascular status: stable Postop Assessment: no apparent nausea or vomiting Anesthetic complications: no     Last Vitals:  Vitals:   03/06/20 1138  BP: 122/78  Pulse: 75  Resp: 18  Temp: 36.7 C  SpO2: 97%    Last Pain:  Vitals:   03/06/20 1251  TempSrc:   PainSc: 0-No pain                 Parthenia Tellefsen

## 2020-03-06 NOTE — Anesthesia Preprocedure Evaluation (Addendum)
Anesthesia Evaluation  Patient identified by MRN, date of birth, ID band Patient awake    Reviewed: Allergy & Precautions, NPO status , Patient's Chart, lab work & pertinent test results  History of Anesthesia Complications Negative for: history of anesthetic complications  Airway Mallampati: II  TM Distance: >3 FB Neck ROM: Full    Dental  (+) Caps, Dental Advisory Given   Pulmonary sleep apnea (snoring) , former smoker,    Pulmonary exam normal breath sounds clear to auscultation       Cardiovascular Exercise Tolerance: Good hypertension, Pt. on medications Normal cardiovascular exam Rhythm:Regular Rate:Normal     Neuro/Psych    GI/Hepatic negative GI ROS, Bowel prep,(+)     substance abuse (6 to 12 drinks/day, 3 to 4 times/week)  alcohol use,   Endo/Other  negative endocrine ROS  Renal/GU negative Renal ROS  negative genitourinary   Musculoskeletal negative musculoskeletal ROS (+)   Abdominal   Peds  Hematology negative hematology ROS (+)   Anesthesia Other Findings   Reproductive/Obstetrics negative OB ROS                           Anesthesia Physical Anesthesia Plan  ASA: II  Anesthesia Plan: General   Post-op Pain Management:    Induction: Intravenous  PONV Risk Score and Plan:   Airway Management Planned: Nasal Cannula, Natural Airway and Simple Face Mask  Additional Equipment:   Intra-op Plan:   Post-operative Plan:   Informed Consent: I have reviewed the patients History and Physical, chart, labs and discussed the procedure including the risks, benefits and alternatives for the proposed anesthesia with the patient or authorized representative who has indicated his/her understanding and acceptance.     Dental advisory given  Plan Discussed with: CRNA  Anesthesia Plan Comments:         Anesthesia Quick Evaluation

## 2020-03-06 NOTE — Op Note (Signed)
[facility] Patient Name: Noah Rivera Procedure Date: 03/06/2020 12:09 PM MRN: [id number] Date of Birth: 10-Dec-1966 Attending MD: Norvel Richards , MD CSN: [id number] Age: 54 Admit Type: Outpatient Procedure:                Colonoscopy Indications:              Screening for colorectal malignant neoplasm Providers:                Norvel Richards, MD, Otis Peak B. Sharon Seller, RN,                            Raphael Gibney, Technician Referring MD:              Medicines:                Propofol per Anesthesia Complications:            No immediate complications. Estimated Blood Loss:     Estimated blood loss was minimal. Procedure:                Pre-Anesthesia Assessment:                           - Prior to the procedure, a History and Physical                            was performed, and patient medications and                            allergies were reviewed. The patient's tolerance of                            previous anesthesia was also reviewed. The risks                            and benefits of the procedure and the sedation                            options and risks were discussed with the patient.                            All questions were answered, and informed consent                            was obtained. Prior Anticoagulants: The patient has                            taken no previous anticoagulant or antiplatelet                            agents. ASA Grade Assessment: II - A patient with                            mild systemic disease. After reviewing the risks  and benefits, the patient was deemed in                            satisfactory condition to undergo the procedure.                           After obtaining informed consent, the colonoscope                            was passed under direct vision. Throughout the                            procedure, the patient's blood pressure, pulse, and            oxygen saturations were monitored continuously. The                            [id number] scope was introduced through                            the anus and advanced to the the cecum, identified                            by appendiceal orifice and ileocecal valve. The                            colonoscopy was performed without difficulty. The                            patient tolerated the procedure well. The quality                            of the bowel preparation was adequate. Scope In: 12:58:19 PM Scope Out: 1:16:06 PM Scope Withdrawal Time: 0 hours 13 minutes 24 seconds  Total Procedure Duration: 0 hours 17 minutes 47 seconds  Findings:      The perianal and digital rectal examinations were normal.      Scattered medium-mouthed diverticula were found in the sigmoid colon and       descending colon.      A 4 mm polyp was found in the cecum. The polyp was sessile. The polyp       was removed with a cold snare. Resection and retrieval were complete.       Estimated blood loss was minimal.      The exam was otherwise without abnormality on direct and retroflexion       views. Impression:               - Diverticulosis in the sigmoid colon and in the                            descending colon.                           - One 4 mm polyp in the cecum, removed with a cold  snare. Resected and retrieved.                           - The examination was otherwise normal on direct                            and retroflexion views. Moderate Sedation:      Moderate (conscious) sedation was personally administered by an       anesthesia professional. The following parameters were monitored: oxygen       saturation, heart rate, blood pressure, respiratory rate, EKG, adequacy       of pulmonary ventilation, and response to care. Recommendation:           - Patient has a contact number available for                            emergencies. The  signs and symptoms of potential                            delayed complications were discussed with the                            patient. Return to normal activities tomorrow.                            Written discharge instructions were provided to the                            patient.                           - Resume previous diet.                           - Continue present medications.                           - Repeat colonoscopy date to be determined after                            pending pathology results are reviewed for                            surveillance.                           - Return to GI office (date not yet determined). Procedure Code(s):        --- Professional ---                           [phone], Colonoscopy, flexible; with removal of                            tumor(s), polyp(s), or other lesion(s) by snare  technique Diagnosis Code(s):        --- Professional ---                           Z12.11, Encounter for screening for malignant                            neoplasm of colon                           K63.5, Polyp of colon                           K57.30, Diverticulosis of large intestine without                            perforation or abscess without bleeding CPT copyright 2019 American Medical Association. All rights reserved. The codes documented in this report are preliminary and upon coder review may  be revised to meet current compliance requirements. Cristopher Estimable. Blake Vetrano, MD Norvel Richards, MD 03/06/2020 1:22:56 PM This report has been signed electronically. Number of Addenda: 0

## 2020-03-09 LAB — SURGICAL PATHOLOGY

## 2020-03-10 ENCOUNTER — Encounter: Payer: Self-pay | Admitting: Internal Medicine
